# Patient Record
Sex: Female | Born: 1990 | Race: Black or African American | Hispanic: No | Marital: Single
Health system: Southern US, Community
[De-identification: ages and names within clinical notes are randomized; demographics above are authoritative.]

---

## 2016-01-27 ENCOUNTER — Other Ambulatory Visit: Payer: Self-pay | Admitting: Infectious Disease

## 2016-01-27 ENCOUNTER — Ambulatory Visit
Admission: RE | Admit: 2016-01-27 | Discharge: 2016-01-27 | Disposition: A | Discharge status: Home / Self Care | Payer: No Typology Code available for payment source | Source: Ambulatory Visit | Attending: Infectious Disease | Admitting: Infectious Disease

## 2016-01-27 DIAGNOSIS — R7612 Nonspecific reaction to cell mediated immunity measurement of gamma interferon antigen response without active tuberculosis: Secondary | ICD-10-CM

## 2018-12-07 ENCOUNTER — Other Ambulatory Visit: Payer: Self-pay | Admitting: Family Medicine

## 2018-12-07 DIAGNOSIS — N6310 Unspecified lump in the right breast, unspecified quadrant: Secondary | ICD-10-CM

## 2018-12-13 ENCOUNTER — Other Ambulatory Visit: Payer: Self-pay

## 2018-12-13 ENCOUNTER — Other Ambulatory Visit: Payer: Self-pay | Admitting: Family Medicine

## 2018-12-13 ENCOUNTER — Ambulatory Visit
Admission: RE | Admit: 2018-12-13 | Discharge: 2018-12-13 | Disposition: A | Discharge status: Home / Self Care | Payer: BC Managed Care – PPO | Source: Ambulatory Visit | Attending: Family Medicine | Admitting: Family Medicine

## 2018-12-13 DIAGNOSIS — N6310 Unspecified lump in the right breast, unspecified quadrant: Secondary | ICD-10-CM

## 2018-12-18 ENCOUNTER — Ambulatory Visit
Admission: RE | Admit: 2018-12-18 | Discharge: 2018-12-18 | Disposition: A | Discharge status: Home / Self Care | Payer: BC Managed Care – PPO | Source: Ambulatory Visit | Attending: Family Medicine | Admitting: Family Medicine

## 2018-12-18 ENCOUNTER — Other Ambulatory Visit: Payer: Self-pay | Admitting: Family Medicine

## 2018-12-18 ENCOUNTER — Other Ambulatory Visit: Payer: Self-pay

## 2018-12-18 DIAGNOSIS — N6310 Unspecified lump in the right breast, unspecified quadrant: Secondary | ICD-10-CM

## 2019-01-01 ENCOUNTER — Ambulatory Visit: Payer: Self-pay | Admitting: Surgery

## 2019-01-01 DIAGNOSIS — N6310 Unspecified lump in the right breast, unspecified quadrant: Secondary | ICD-10-CM

## 2019-01-01 NOTE — H&P (Signed)
History of Present Illness Wilmon Arms. Analilia Geddis MD; 01/01/2019 10:48 AM) The patient is a 28 year old female who presents with a complaint of Mass. 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 Recently, she noticed a new palpable mass in the upper outer quadrant just below the edge of the areola. She underwent mammogram and ultrasound as well as biopsy. Ultrasound showed a 2.8 x 2.0 x 1.6 cm fibroadenoma at 8:00, 2 cm from the nipple. This was confirmed on biopsy. At 11:30, 1 cm from the nipple, there is a 1.8 x 0.8 x 1.0 cm irregular hypoechoic mass just deep to the dermis. Biopsy showed that this represented a complex sclerosing lesion. The axilla showed no lymphadenopathy.  Menarche age 57 Never pregnant Oral contraceptives 1 year Family history negative for breast cancer  CLINICAL DATA: Patient reports 2 right breast lumps. The larger lump has been present for approximately 5 years and unchanged. A smaller superior retroareolar lump is new.  EXAM: ULTRASOUND OF THE RIGHT BREAST  COMPARISON: None.  FINDINGS: On physical exam, there is a mobile smooth is mass in the lower outer quadrant of the right breast which corresponds to the stable palpable mass.  In the retroareolar, 12 o'clock position there is a smaller firm superficial mass.  Targeted ultrasound is performed, showing an oval hypoechoic circumscribed parallel mass at 8 o'clock, 2 cm the nipple, measuring 2.8 x 1.6 x 2.0 cm. This corresponds to the stable palpable mass.  In the 11:30 o'clock position, 1 cm from the nipple, just below the superior areolar margin, there is an irregular hypoechoic mass that abuts the deep margin of the dermis. It is vascular, with partly ill-defined margins, measuring 1.8 x 0.8 x 1.0 cm.  Sonographic evaluation of the right axilla shows no enlarged or abnormal lymph  nodes.  IMPRESSION: 1. Indeterminate mass in the superior, retroareolar right breast at 11:30 o'clock measuring 1.8 cm in long axis. Tissue sampling is recommended. 2. Probably benign mass in the 8 o'clock position of the right breast.  RECOMMENDATION: 1. Ultrasound-guided core needle biopsy of the 11:30 o'clock position retroareolar right breast mass. Recommend also biopsying the larger 8 o'clock probably benign mass to confirm it as a benign fibroadenoma.  I have discussed the findings and recommendations with the patient. If applicable, a reminder letter will be sent to the patient regarding the next appointment.  BI-RADS CATEGORY 4: Suspicious.   Electronically Signed By: Amie Portland M.D. On: 12/13/2018 08:02  CLINICAL DATA: Patient underwent biopsy of 2 right-sided breast masses.  EXAM: DIGITAL DIAGNOSTIC UNILATERAL LEFT MAMMOGRAM WITH CAD AND TOMO  COMPARISON: Previous exam(s).  ACR Breast Density Category d: The breast tissue is extremely dense, which lowers the sensitivity of mammography.  FINDINGS: No suspicious mass, distortion, or microcalcifications are identified to suggest presence of malignancy in the left breast.  Mammographic images were processed with CAD.  IMPRESSION: No mammographic evidence of malignancy in the left breast.  RECOMMENDATION: Begin annual screening mammography at age 73.  I have discussed the findings and recommendations with the patient. If applicable, a reminder letter will be sent to the patient regarding the next appointment.  BI-RADS CATEGORY 1: Negative.   Electronically Signed By: Emmaline Kluver M.D. On: 12/18/2018 16:54  CLINICAL DATA: Patient underwent biopsy of 2 right-sided breast masses.  EXAM: DIAGNOSTIC RIGHT MAMMOGRAM POST ULTRASOUND BIOPSY  COMPARISON: Previous exam(s).  FINDINGS: Mammographic images were obtained following ultrasound guided biopsy of a right breast mass at 11:30  o'clock and a right breast mass at 8 o'clock. The biopsy marking clips are in expected position at the site of biopsy.  IMPRESSION: 1. Appropriate positioning of the ribbon shaped biopsy marking clip at the site of biopsy in the right breast at 11:30 o'clock.  2. Appropriate positioning of the coil shaped biopsy marking clip at the site of biopsy in the right breast at 8 o'clock.  Final Assessment: Post Procedure Mammograms for Marker Placement   Electronically Signed By: Emmaline KluverNancy Ballantyne M.D. On: 12/18/2018 16:53   Problem List/Past Medical Molli Hazard(Yalissa Fink K. 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  Allergies Santiago Glad([place]sey [place]; 01/01/2019 10:27 AM) Chloroquine Phosphate *ANTIMALARIALS*  Allergies Reconciled   Medication History Santiago Glad(Kelsey Phillips, CMA; 01/01/2019 10:27 AM) Medications Reconciled  Social History Santiago Glad(Kelsey Phillips, CMA; 01/01/2019 10:25 AM) Caffeine use  Coffee. No drug use  Tobacco use  Never smoker.  Family History Santiago Glad([place]; 01/01/2019 10:25 AM) Arthritis  Father, Mother. Prostate Cancer  Father.  Other Problems Wilmon Arms(Elizah Mierzwa K. Rosalind Guido, MD; 01/01/2019 10:48 AM) Gastric Ulcer     Review of Systems Santiago Glad(Kelsey Phillips CMA; 01/01/2019 10:25 AM) Neurological Not Present- Decreased Memory, Fainting, Headaches, Numbness, Seizures, Tingling, Tremor, Trouble walking and Weakness. Psychiatric Not Present- Anxiety, Bipolar, Change in Sleep Pattern, Depression, Fearful and Frequent crying.  Vitals Santiago Glad(Kelsey Phillips CMA; 01/01/2019 10:27 AM) 01/01/2019 10:26 AM Weight: 111.6 lb Height: 62in Body Surface Area: 1.49 m Body Mass Index: 20.41 kg/m  Temp.: 97.12F  Pulse: 92 (Regular)  BP: 104/78 (Sitting, Left Arm, Standard)       Physical Exam Molli Hazard(Priti Consoli K. Randale Carvalho MD;  01/01/2019 10:49 AM) The physical exam findings are as follows: Note:WDWN in NAD Eyes: Pupils equal, round; sclera anicteric HENT: Oral mucosa moist; good dentition Neck: No masses palpated, no thyromegaly Lungs: CTA bilaterally; normal respiratory effort Breast: Symmetrical, no nipple retraction or discharge, bilateral fibrocystic changes, no axillary lymphadenopathy. Right lower outer quadrant shows a 2 cm firm mobile palpable mass. The right retroareolar region in the upper outer quadrant shows what appears to be a firm 1 cm palpable mass. CV: Regular rate and rhythm; no murmurs; extremities well-perfused with no edema Abd: +bowel sounds, soft, non-tender, no palpable organomegaly; no palpable hernias Skin: Warm, dry; no sign of jaundice Psychiatric - alert and oriented x 4; calm mood and affect    Assessment & Plan Molli Hazard(Adda Stokes K. 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 The surgical procedure has been discussed with the patient. Potential risks, benefits, alternative treatments, and expected outcomes have been explained. All of the patient's questions at this time have been answered. The likelihood of reaching the patient's treatment goal is good. The patient understand the proposed surgical procedure and wishes to proceed. BREAST MASS, RIGHT (N63.10) Impression: Complex sclerosing lesion - 11:30 near nipple  Note:We recommend excision of both of these areas. This can be performed through the same incision. I explained that the complex sclerosing lesion does not represent cancer but there is a higher percentage risk of eventual progression to breast cancer if left in place. Therefore we recommend excision.  Wilmon ArmsMatthew K. Kiela Shisler, MD, FACS  [facility]  General/ Trauma  Surgery   01/01/2019 10:51 AM

## 2021-04-30 IMAGING — MG MM BREAST LOCALIZATION CLIP
4 series · 4 of 12 positions shown · non-contrast
Comparison: Previous exam(s).

CLINICAL DATA: Patient underwent biopsy of 2 right-sided breast
masses.

EXAM:
DIAGNOSTIC RIGHT MAMMOGRAM POST ULTRASOUND BIOPSY

[R CC synth-2D]
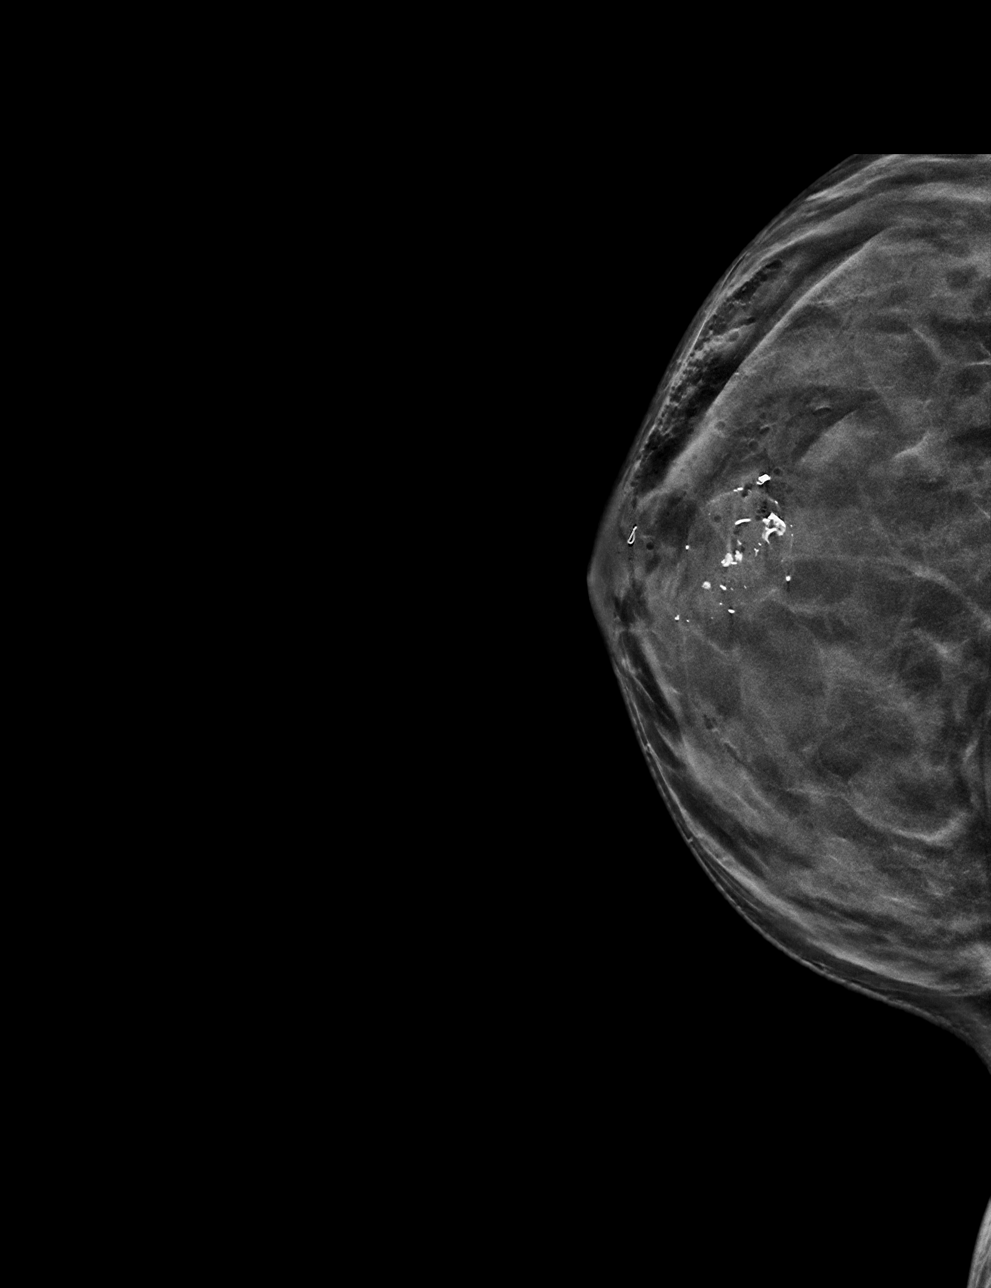

[R ML synth-2D]
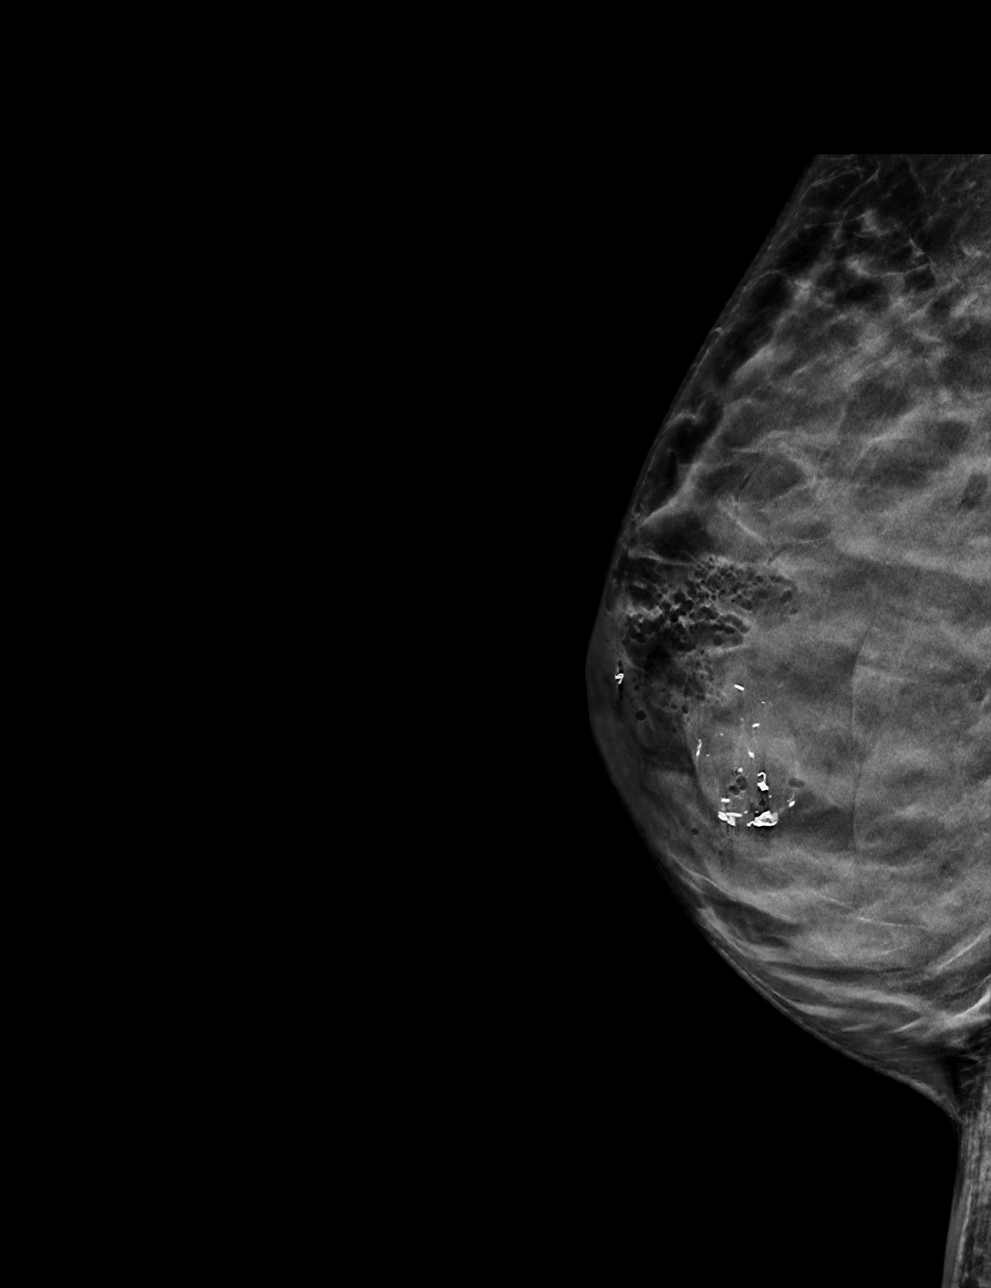

[R CC tomo · tomo slice 29/57.0]
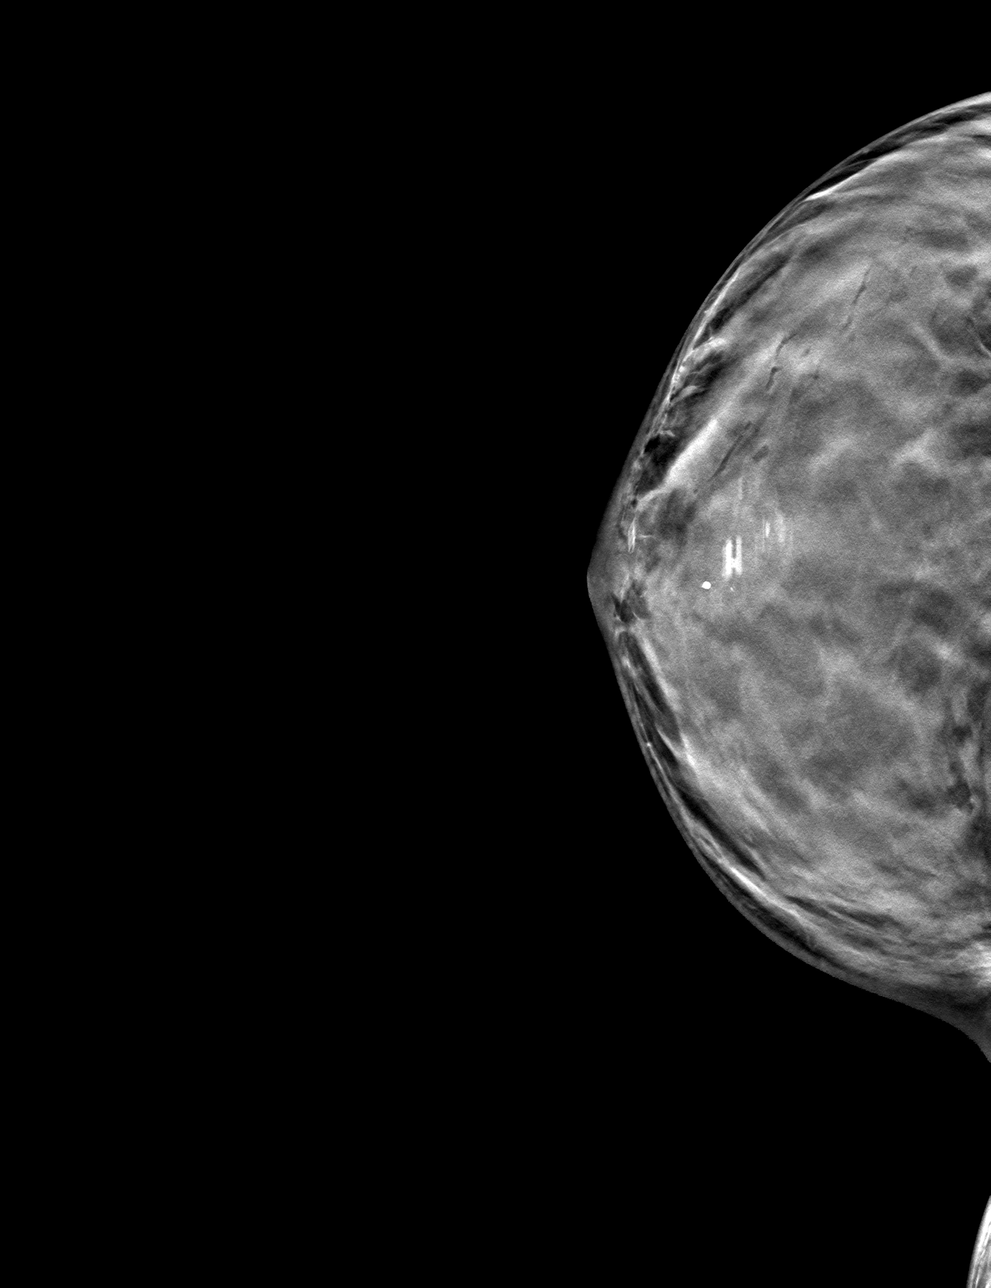

[R ML tomo · tomo slice 31/60.0]
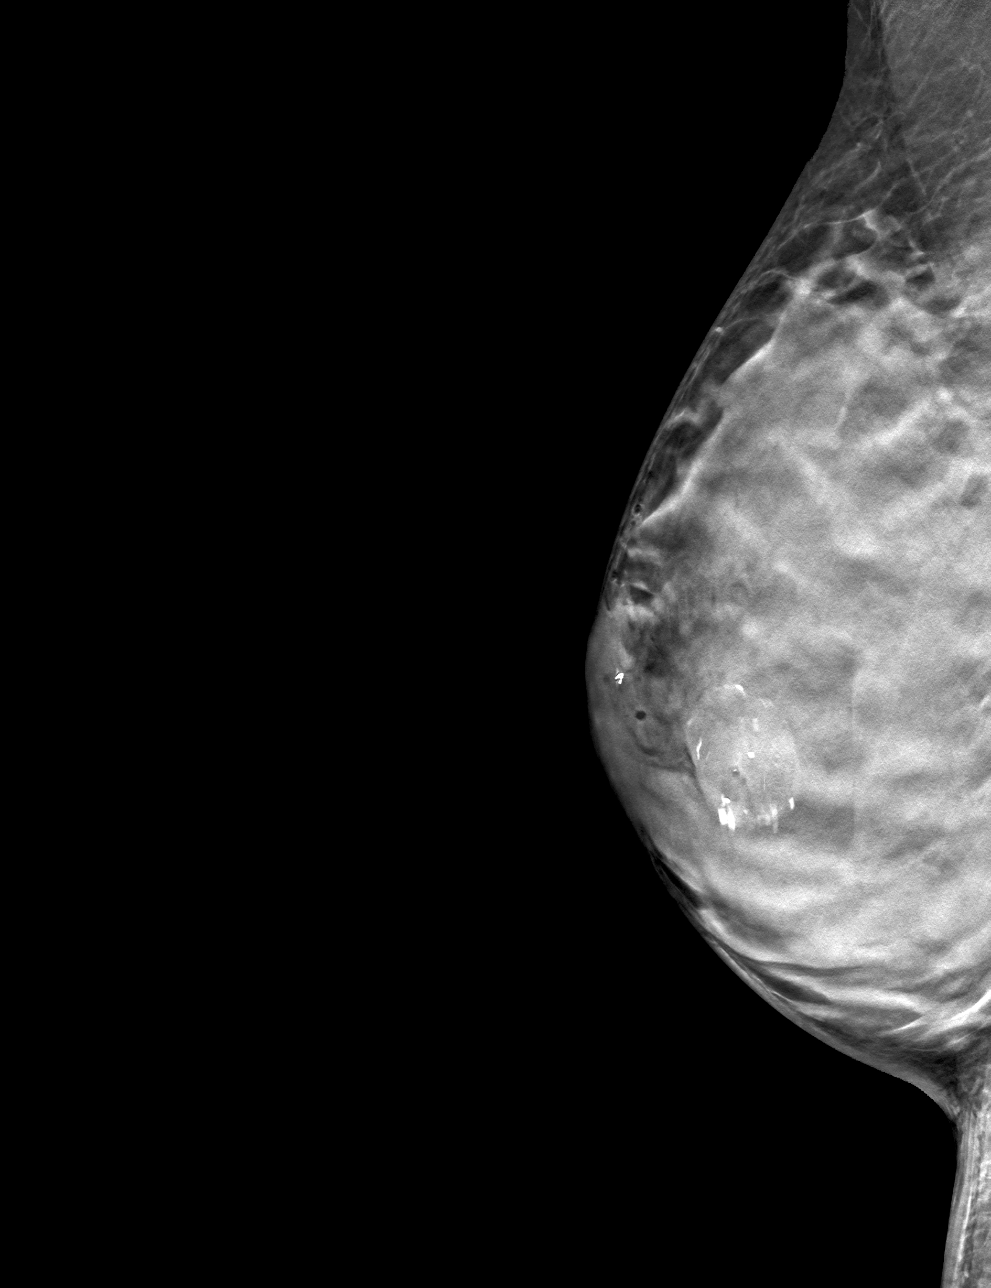

[4 of 12 positions shown; findings below may reference images not displayed]

FINDINGS: Mammographic images were obtained following ultrasound guided biopsy
of a right breast mass at 11:30 o'clock and a right breast mass at 8
o'clock. The biopsy marking clips are in expected position at the
site of biopsy.
IMPRESSION: 1. Appropriate positioning of the ribbon shaped biopsy marking clip
at the site of biopsy in the right breast at 11:30 o'clock.

2. Appropriate positioning of the coil shaped biopsy marking clip at
the site of biopsy in the right breast at 8 o'clock.

Final Assessment: Post Procedure Mammograms for Marker Placement

## 2021-04-30 IMAGING — US US  BREAST BX W/ LOC DEV 1ST LESION IMG BX SPEC US GUIDE*R*
1 series · 12 of 17 positions shown · non-contrast
Comparison: Previous exam(s).
COMPARISON: Previous exam(s).

Addendum:
CLINICAL DATA: Patient presents for biopsy of two right-sided
breast masses.

EXAM:
ULTRASOUND GUIDED RIGHT BREAST CORE NEEDLE BIOPSY x 2

[Series 1: us breast bx w/ loc dev 1st lesion img bx spec us  · 0.07mm/px · 12 of 17 slices shown]
[im 1/17]
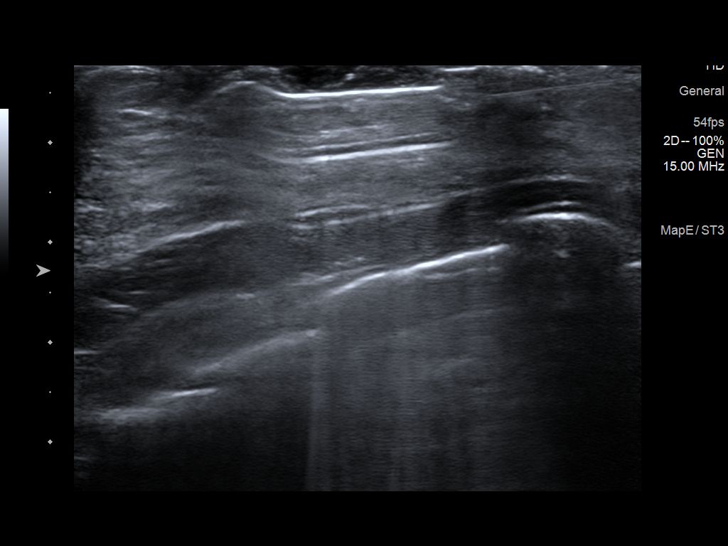
[im 3/17]
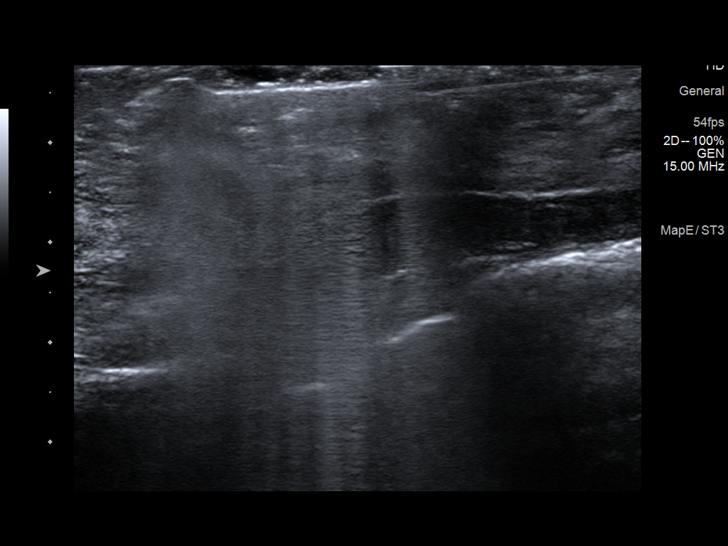
[im 4/17]
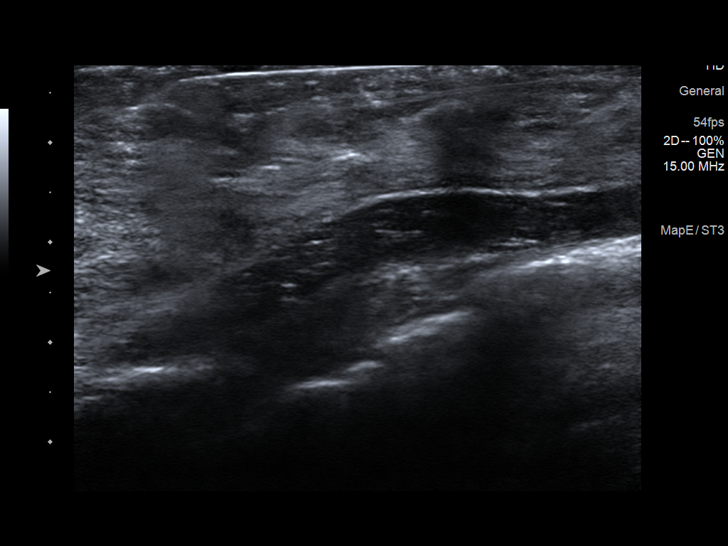
[im 5/17]
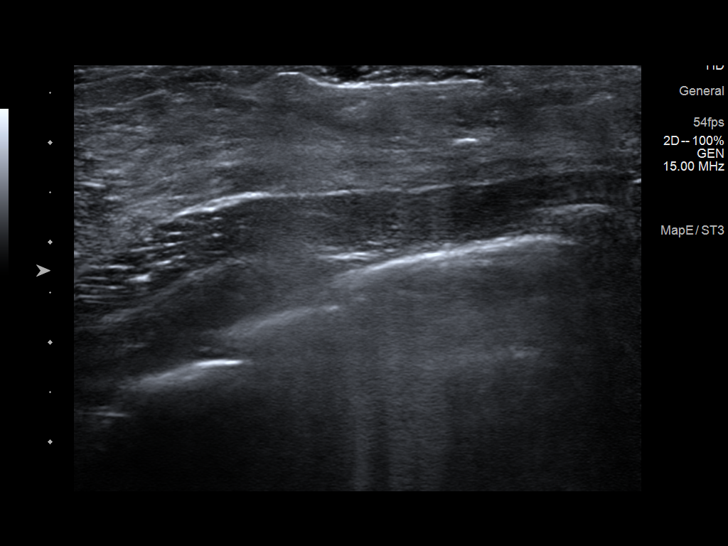
[im 7/17]
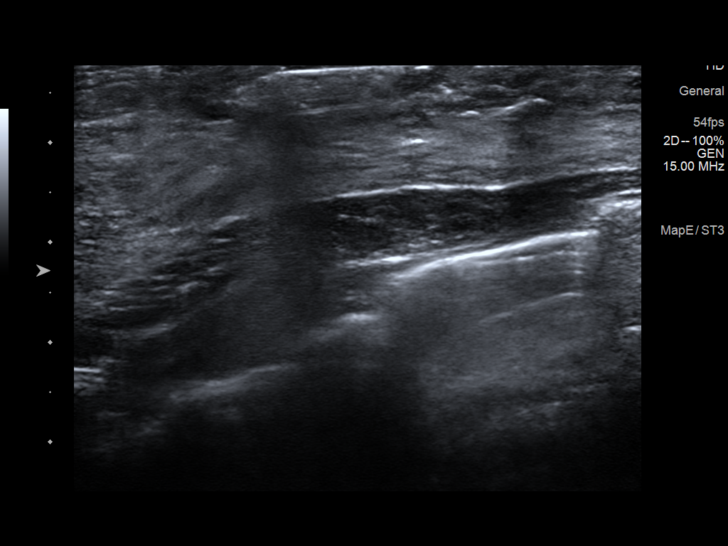
[im 8/17]
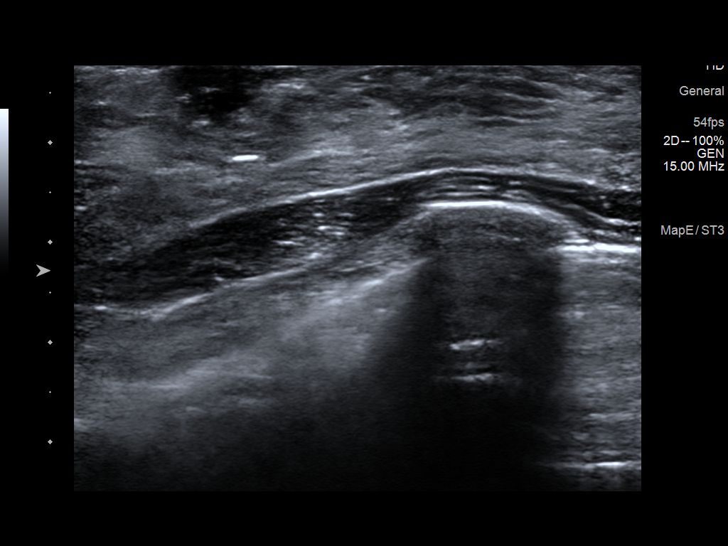
[im 10/17]
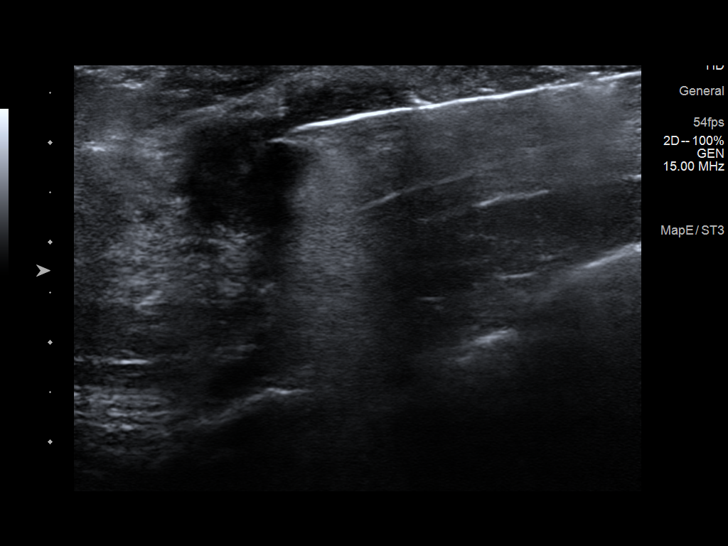
[im 11/17]
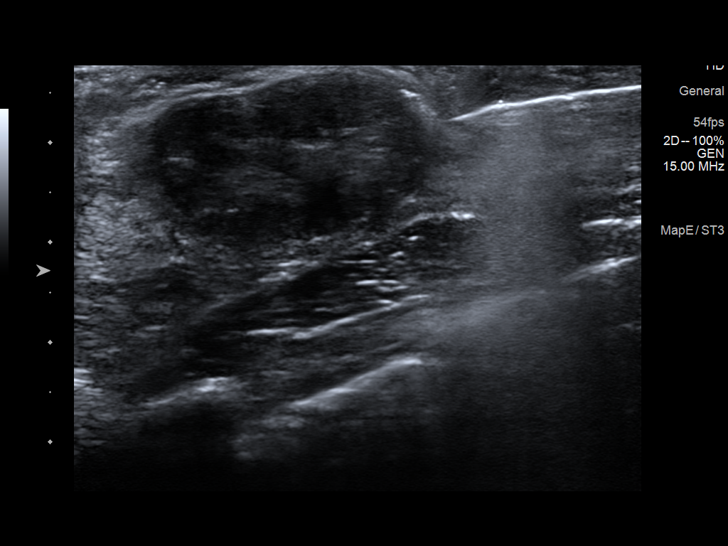
[im 13/17]
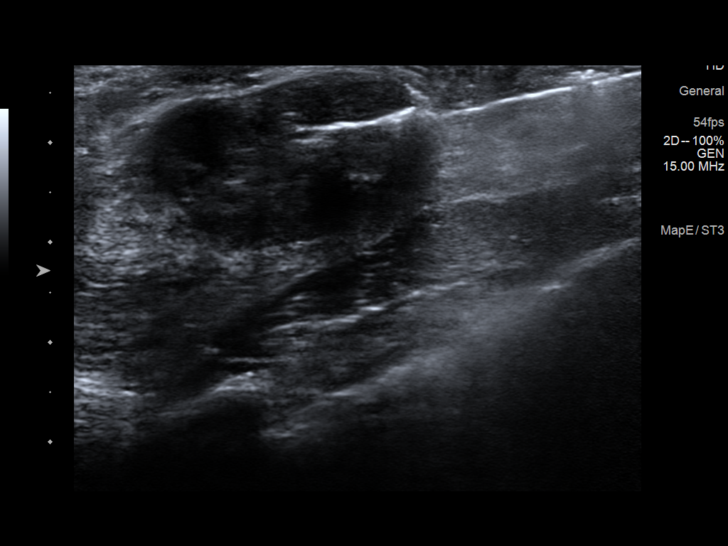
[im 14/17]
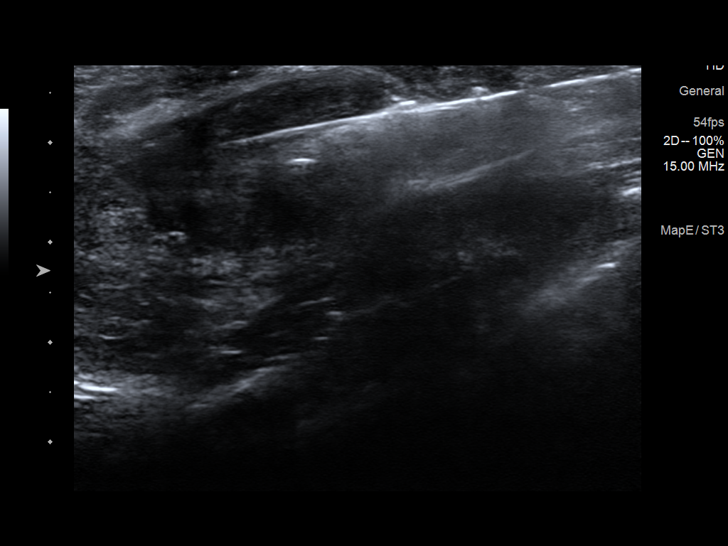
[im 15/17]
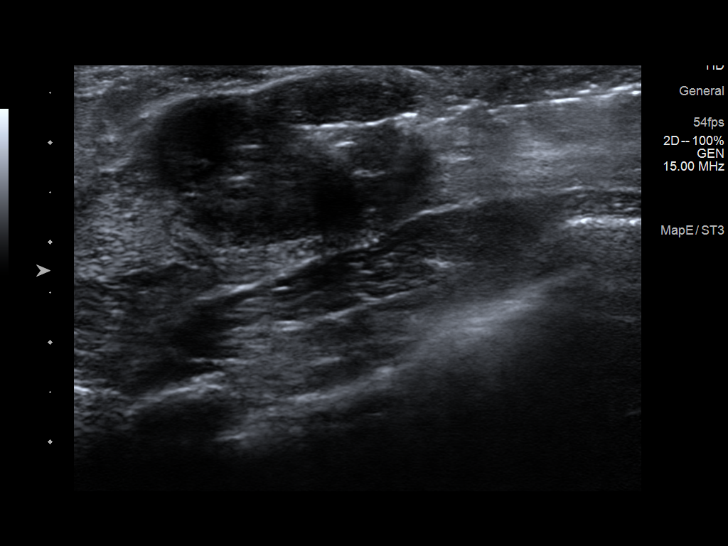
[im 17/17]
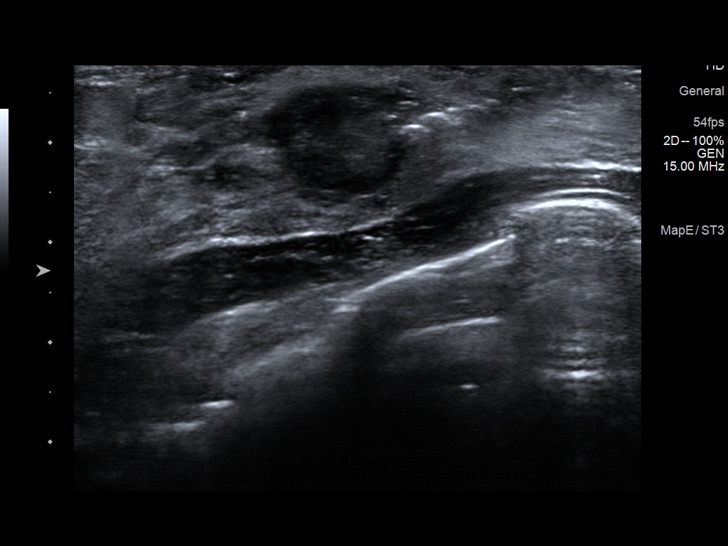

[12 of 17 positions shown; findings below may reference images not displayed]



1.  Lesion quadrant: Upper outer quadrant

Using sterile technique and 1% Lidocaine as local anesthetic, under
direct ultrasound visualization, a 14 gauge Sanghera device was
used to perform biopsy of a right breast mass at 11:30 o'clock using
a inferior approach. At the conclusion of the procedure a ribbon
tissue marker clip was deployed into the biopsy cavity. Follow up 2
view mammogram was performed and dictated separately.

2.  Lesion quadrant: Lower outer quadrant

Using sterile technique and 1% Lidocaine as local anesthetic, under
direct ultrasound visualization, a 14 gauge Sanghera device was
used to perform biopsy of a right breast mass at 8 o'clock using a
inferior approach. At the conclusion of the procedure a coil tissue
marker clip was deployed into the biopsy cavity. Follow up 2 view
mammogram was performed and dictated separately.
IMPRESSION: Ultrasound guided biopsies of a right breast mass at 8 o'clock and a
right breast mass at 11:30 o'clock. No apparent complications.

ADDENDUM:
Pathology revealed COMPLEX SCLEROSING LESION of the Right breast,
11:30 o'clock. This was found to be concordant by Dr. Swanson
Klever, with excision recommended.

Pathology revealed FIBROADENOMA of the Right breast, 8 o'clock. This
was found to be concordant by Dr. Linternational Mangi.

Pathology results were discussed with the patient by telephone. The
patient reported doing well after the biopsies with tenderness at
the sites. Post biopsy instructions and care were reviewed and
questions were answered. The patient was encouraged to call The

Surgical consultation has been arranged with Dr. Jassiel Bartels at
[REDACTED] on January 01, 2019.

Pathology results reported by Jevan Tiger, RN on 12/19/2018.



1.  Lesion quadrant: Upper outer quadrant

Using sterile technique and 1% Lidocaine as local anesthetic, under
direct ultrasound visualization, a 14 gauge Sanghera device was
used to perform biopsy of a right breast mass at 11:30 o'clock using
a inferior approach. At the conclusion of the procedure a ribbon
tissue marker clip was deployed into the biopsy cavity. Follow up 2
view mammogram was performed and dictated separately.

2.  Lesion quadrant: Lower outer quadrant

Using sterile technique and 1% Lidocaine as local anesthetic, under
direct ultrasound visualization, a 14 gauge Sanghera device was
used to perform biopsy of a right breast mass at 8 o'clock using a
inferior approach. At the conclusion of the procedure a coil tissue
marker clip was deployed into the biopsy cavity. Follow up 2 view
mammogram was performed and dictated separately.
IMPRESSION: Ultrasound guided biopsies of a right breast mass at 8 o'clock and a
right breast mass at 11:30 o'clock. No apparent complications.
